# Patient Record
Sex: Male | Born: 1955 | Race: White | Hispanic: No
Health system: Southern US, Community
[De-identification: ages and names within clinical notes are randomized; demographics above are authoritative.]

## PROBLEM LIST (undated history)

## (undated) DIAGNOSIS — J45909 Unspecified asthma, uncomplicated: Secondary | ICD-10-CM

## (undated) HISTORY — PX: PROSTATECTOMY: SHX69

---

## 2008-07-22 ENCOUNTER — Ambulatory Visit: Payer: Self-pay | Admitting: Internal Medicine

## 2010-10-24 ENCOUNTER — Ambulatory Visit: Payer: Self-pay | Admitting: Family Medicine

## 2019-05-25 ENCOUNTER — Ambulatory Visit: Payer: Self-pay | Attending: Internal Medicine

## 2019-05-25 ENCOUNTER — Ambulatory Visit: Payer: Self-pay

## 2019-05-25 DIAGNOSIS — Z23 Encounter for immunization: Secondary | ICD-10-CM

## 2019-05-25 NOTE — Progress Notes (Signed)
   Covid-19 Vaccination Clinic  Name:  Dean Wall    MRN: [id number] DOB: 05/02/1955  05/25/2019  [name] was observed post Covid-19 immunization for 15 minutes without incident. He was provided with Vaccine Information Sheet and instruction to access the V-Safe system.   [name] was instructed to call 911 with any severe reactions post vaccine: Marland Kitchen Difficulty breathing  . Swelling of face and throat  . A fast heartbeat  . A bad rash all over body  . Dizziness and weakness   Immunizations Administered    Name Date Dose VIS Date Route   Pfizer COVID-19 Vaccine 05/25/2019  8:57 AM 0.3 mL 01/25/2019 Intramuscular   Manufacturer: [employer], [employer]   Lot: [id number]   NDC: [address]-7

## 2019-06-18 ENCOUNTER — Ambulatory Visit: Payer: Self-pay | Attending: Internal Medicine

## 2019-06-18 DIAGNOSIS — Z23 Encounter for immunization: Secondary | ICD-10-CM

## 2019-06-18 NOTE — Progress Notes (Signed)
   Covid-19 Vaccination Clinic  Name:  Dean Wall    MRN: [id number] DOB: 1956/02/11  06/18/2019  Mr. Heidelberg was observed post Covid-19 immunization for 15 minutes without incident. He was provided with Vaccine Information Sheet and instruction to access the V-Safe system.   Mr. Wimer was instructed to call 911 with any severe reactions post vaccine: Marland Kitchen Difficulty breathing  . Swelling of face and throat  . A fast heartbeat  . A bad rash all over body  . Dizziness and weakness   Immunizations Administered    Name Date Dose VIS Date Route   Pfizer COVID-19 Vaccine 06/18/2019  3:58 PM 0.3 mL 04/10/2018 Intramuscular   Manufacturer: [employer], [employer]   Lot: [id number]   NDC: [address]-6

## 2019-12-14 ENCOUNTER — Encounter: Payer: Self-pay | Admitting: Emergency Medicine

## 2019-12-14 ENCOUNTER — Emergency Department
Admission: EM | Admit: 2019-12-14 | Discharge: 2019-12-14 | Disposition: A | Discharge status: Home / Self Care | Payer: BC Managed Care – PPO | Attending: Emergency Medicine | Admitting: Emergency Medicine

## 2019-12-14 ENCOUNTER — Emergency Department: Payer: BC Managed Care – PPO

## 2019-12-14 ENCOUNTER — Other Ambulatory Visit: Payer: Self-pay

## 2019-12-14 DIAGNOSIS — R1031 Right lower quadrant pain: Secondary | ICD-10-CM | POA: Diagnosis present

## 2019-12-14 DIAGNOSIS — J45909 Unspecified asthma, uncomplicated: Secondary | ICD-10-CM | POA: Insufficient documentation

## 2019-12-14 DIAGNOSIS — R109 Unspecified abdominal pain: Secondary | ICD-10-CM

## 2019-12-14 HISTORY — DX: Unspecified asthma, uncomplicated: J45.909

## 2019-12-14 LAB — URINALYSIS, COMPLETE (UACMP) WITH MICROSCOPIC
Bacteria, UA: NONE SEEN
Bilirubin Urine: NEGATIVE
Glucose, UA: NEGATIVE mg/dL
Hgb urine dipstick: NEGATIVE
Ketones, ur: 5 mg/dL — AB
Leukocytes,Ua: NEGATIVE
Nitrite: NEGATIVE
Protein, ur: NEGATIVE mg/dL
Specific Gravity, Urine: 1.017 (ref 1.005–1.030)
Squamous Epithelial / HPF: NONE SEEN (ref 0–5)
WBC, UA: NONE SEEN WBC/hpf (ref 0–5)
pH: 5 (ref 5.0–8.0)

## 2019-12-14 LAB — COMPREHENSIVE METABOLIC PANEL
ALT: 44 U/L (ref 0–44)
AST: 23 U/L (ref 15–41)
Albumin: 4.3 g/dL (ref 3.5–5.0)
Alkaline Phosphatase: 68 U/L (ref 38–126)
Anion gap: 9 (ref 5–15)
BUN: 14 mg/dL (ref 8–23)
CO2: 24 mmol/L (ref 22–32)
Calcium: 9.2 mg/dL (ref 8.9–10.3)
Chloride: 106 mmol/L (ref 98–111)
Creatinine, Ser: 0.77 mg/dL (ref 0.61–1.24)
GFR, Estimated: >60 mL/min (ref >60)
Glucose, Bld: 108 mg/dL — ABNORMAL HIGH (ref 70–99)
Potassium: 4.3 mmol/L (ref 3.5–5.1)
Sodium: 139 mmol/L (ref 135–145)
Total Bilirubin: 0.7 mg/dL (ref 0.3–1.2)
Total Protein: 7.4 g/dL (ref 6.5–8.1)

## 2019-12-14 LAB — CBC
HCT: 48.1 % (ref 39.0–52.0)
Hemoglobin: 16.8 g/dL (ref 13.0–17.0)
MCH: 30.1 pg (ref 26.0–34.0)
MCHC: 34.9 g/dL (ref 30.0–36.0)
MCV: 86.2 fL (ref 80.0–100.0)
Platelets: 214 10*3/uL (ref 150–400)
RBC: 5.58 MIL/uL (ref 4.22–5.81)
RDW: 12.3 % (ref 11.5–15.5)
WBC: 6.2 10*3/uL (ref 4.0–10.5)
nRBC: 0 % (ref 0.0–0.2)

## 2019-12-14 LAB — LIPASE, BLOOD: Lipase: 37 U/L (ref 11–51)

## 2019-12-14 MED ORDER — IOHEXOL 300 MG/ML  SOLN
100.0000 mL | Freq: Once | INTRAMUSCULAR | Status: AC | PRN
  Administered 2019-12-14: 100 mL via INTRAVENOUS

## 2019-12-14 MED ORDER — POLYETHYLENE GLYCOL 3350 17 GM/SCOOP PO POWD: 17.0000 g | Freq: Every day | ORAL | 0 refills | Status: AC | PRN

## 2019-12-14 MED ORDER — IOHEXOL 9 MG/ML PO SOLN
500.0000 mL | Freq: Two times a day (BID) | ORAL | Status: DC | PRN
  Administered 2019-12-14: 500 mL via ORAL

## 2019-12-14 NOTE — ED Notes (Addendum)
Patient here for right lower abdominal pain, says his pain has decreased from a 6 to a 4 since he has been in the ED. Patient says this is the second time it has awaken him out of his sleep, he denies N/V says his urine has been darker, no blood in stool. No fever. Patient says the only thing he remembers having a hx of is diverticulitis when younger

## 2019-12-14 NOTE — ED Triage Notes (Signed)
Patient with complaint of right lower abdominal pain that started Friday morning. Patient states that the pain has become more constant. Patient denies nausea, vomiting or urinary symptoms.

## 2019-12-14 NOTE — ED Provider Notes (Signed)
[facility] Emergency Department Provider Note  Time seen: 9:13 AM  I have reviewed the triage vital signs and the nursing notes.   HISTORY  Chief Complaint Abdominal Pain   HPI Dean Wall is a 64 y.o. male with a past medical history of asthma presents to the emergency department for right lower quadrant abdominal pain.  According to the patient since yesterday morning he has been experiencing right lower quadrant abdominal pain.  States it occurred yesterday morning and then went away but recurred yesterday evening and woke him up overnight.  States the pain was a 6/10 aching type pain currently states it is a 4/10 mild pain.  Denies any nausea vomiting diarrhea.  Denies any fever.  Denies dysuria or hematuria.  Past Medical History:  Diagnosis Date  . Asthma     There are no problems to display for this patient.   Past Surgical History:  Procedure Laterality Date  . PROSTATECTOMY      Prior to Admission medications   Not on File    No Known Allergies  No family history on file.  Social History Social History   Tobacco Use  . Smoking status: Never Smoker  . Smokeless tobacco: Never Used  Substance Use Topics  . Alcohol use: Yes    Comment: occ  . Drug use: Never    Review of Systems Constitutional: Negative for fever. Cardiovascular: Negative for chest pain. Respiratory: Negative for shortness of breath. Gastrointestinal: Moderate right lower quadrant abdominal pain.  Negative for nausea vomiting or diarrhea Genitourinary: Negative for urinary compaints Neurological: Negative for headache All other ROS negative  ____________________________________________   PHYSICAL EXAM:  VITAL SIGNS: ED Triage Vitals [12/14/19 0637]  Enc Vitals Group     BP (!) 156/84     Pulse Rate 93     Resp 18     Temp 98.2 F (36.8 C)     Temp Source Oral     SpO2 96 %     Weight 232 lb (105.2 kg)     Height 6\' 1"  (1.854 m)     Head  Circumference      Peak Flow      Pain Score 6     Pain Loc      Pain Edu?      Excl. in GC?    Constitutional: Alert and oriented. Well appearing and in no distress. Eyes: Normal exam ENT      Head: Normocephalic and atraumatic.      Mouth/Throat: Mucous membranes are moist. Cardiovascular: Normal rate, regular rhythm. No murmur Respiratory: Normal respiratory effort without tachypnea nor retractions. Breath sounds are clear  Gastrointestinal: Soft, mild to moderate right lower quadrant abdominal tenderness palpation without rebound guarding or distention.  Abdomen otherwise benign. Musculoskeletal: Nontender with normal range of motion in all extremities.  Neurologic:  Normal speech and language. No gross focal neurologic deficits  Skin:  Skin is warm, dry and intact.  Psychiatric: Mood and affect are normal.   ____________________________________________     RADIOLOGY  CT is essentially negative besides constipation.  ____________________________________________   INITIAL IMPRESSION / ASSESSMENT AND PLAN / ED COURSE  Pertinent labs & imaging results that were available during my care of the patient were reviewed by me and considered in my medical decision making (see chart for details).   Patient presents to the emergency department for right lower quadrant abdominal pain since yesterday.  Overall the patient appears well, no acute distress.  Does have mild to moderate tenderness palpation in the right lower quadrant without rebound guarding or distention.  No other tenderness identified on exam.  However given the right lower quadrant tenderness even though his labs are normal we will proceed with CT scan abdomen/pelvis to evaluate for possible appendicitis.  Patient denies any prior abdominal surgeries.  CT scans essentially negative besides moderate constipation.  We will discharge on MiraLAX.  Have the patient follow-up with his PCP.  Discussed my typical abdominal pain  return precautions.  Dean Wall was evaluated in Emergency Department on 12/14/2019 for the symptoms described in the history of present illness. He was evaluated in the context of the global COVID-19 pandemic, which necessitated consideration that the patient might be at risk for infection with the SARS-CoV-2 virus that causes COVID-19. Institutional protocols and algorithms that pertain to the evaluation of patients at risk for COVID-19 are in a state of rapid change based on information released by regulatory bodies including the CDC and federal and state organizations. These policies and algorithms were followed during the patient's care in the ED.  ____________________________________________   FINAL CLINICAL IMPRESSION(S) / ED DIAGNOSES  Right lower quadrant abdominal pain   Minna Antis, MD 12/14/19 1212

## 2021-10-19 IMAGING — CT CT ABD-PELV W/ CM
2 of 5 series · 16 of 46 positions shown, 18 images · IV contrast (APPLIED)
Comparison: None.

CLINICAL DATA: Right lower quadrant abdominal pain. Clinical
suspicion for appendicitis.

EXAM:
CT ABDOMEN AND PELVIS WITH CONTRAST
TECHNIQUE: Multidetector CT imaging of the abdomen and pelvis was performed
using the standard protocol following bolus administration of
intravenous contrast.
CONTRAST:  100mL OMNIPAQUE IOHEXOL 300 MG/ML  SOLN

[Series 2: routine abd/pel with · axial · 0.90mm/px · z∈[-574,-130]mm · 13 of 101 slices shown, 15 images]
[im 6/101  soft-tissue]
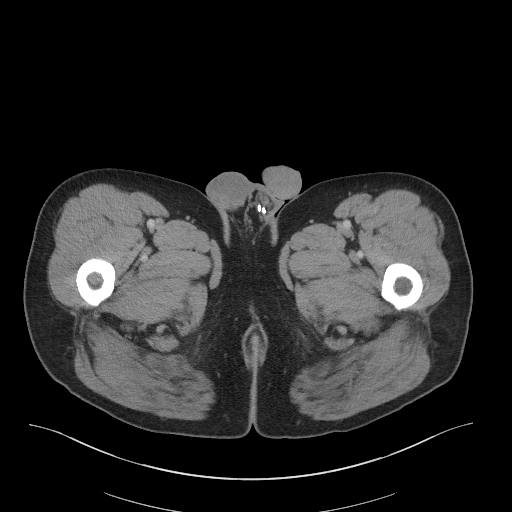
[im 6/101  bone]
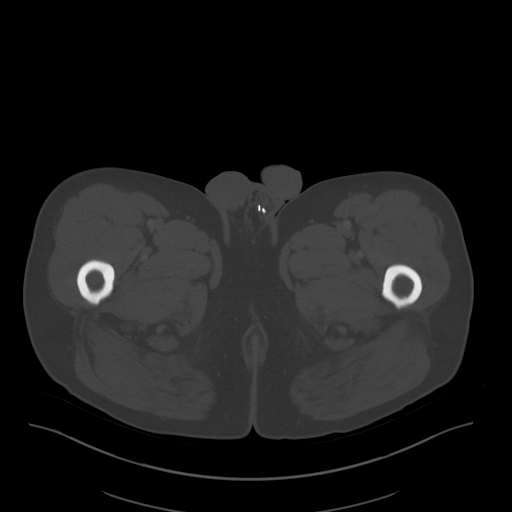
[im 12/101  soft-tissue]
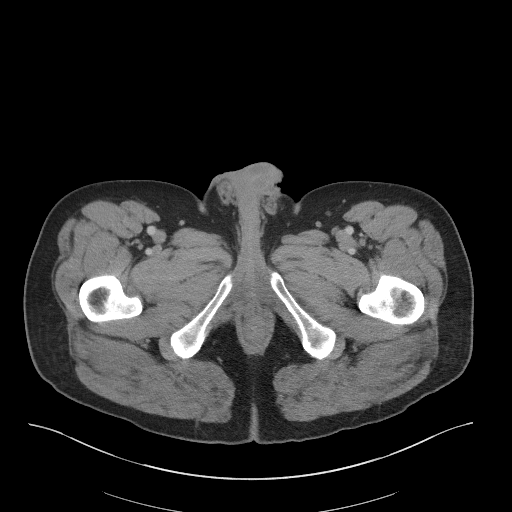
[im 24/101  soft-tissue]
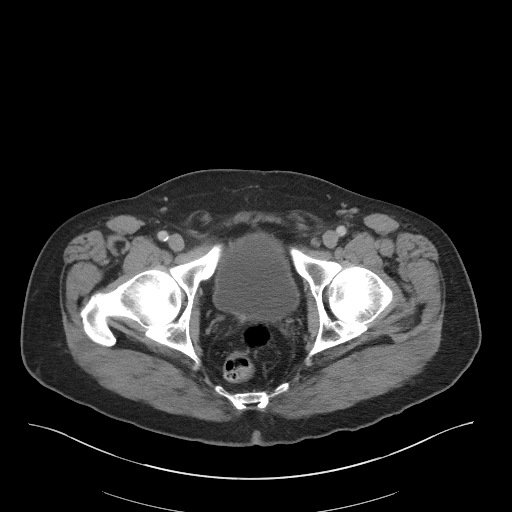
[im 30/101  soft-tissue]
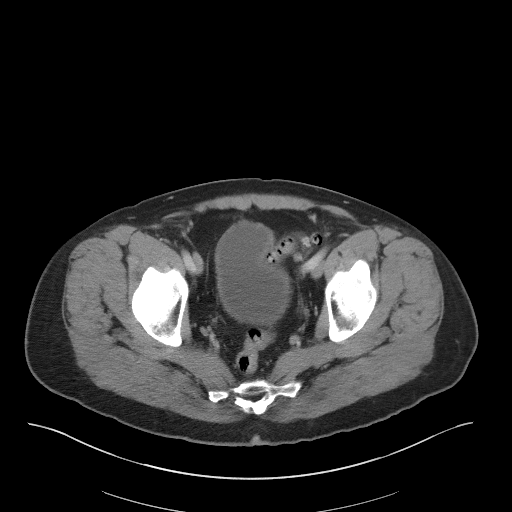
[im 36/101  soft-tissue]
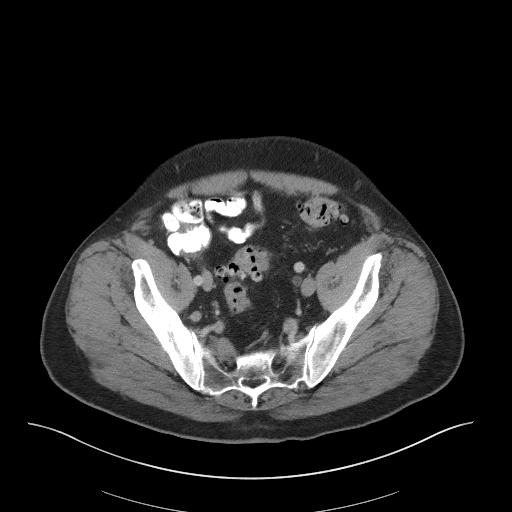
[im 42/101  soft-tissue]
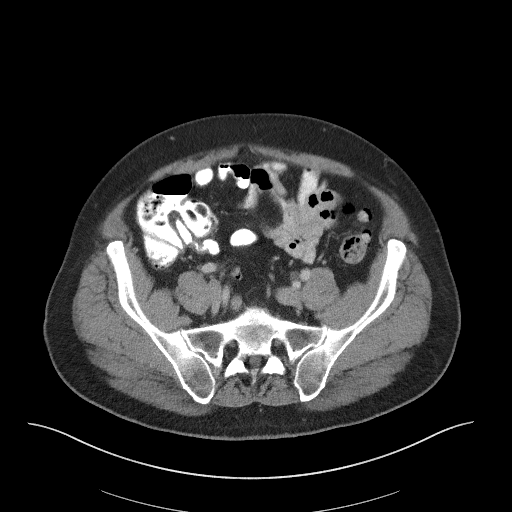
[im 53/101  soft-tissue]
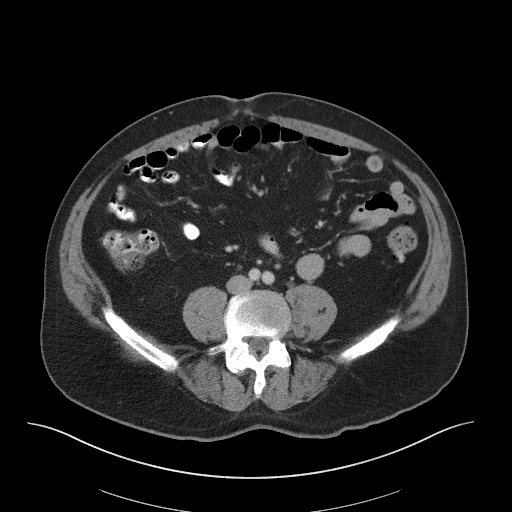
[im 59/101  soft-tissue]
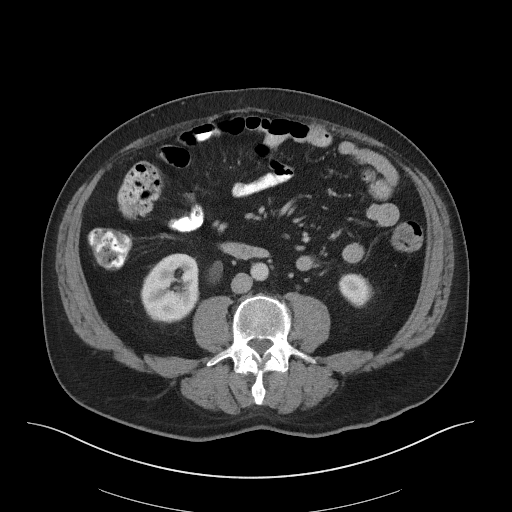
[im 65/101  soft-tissue]
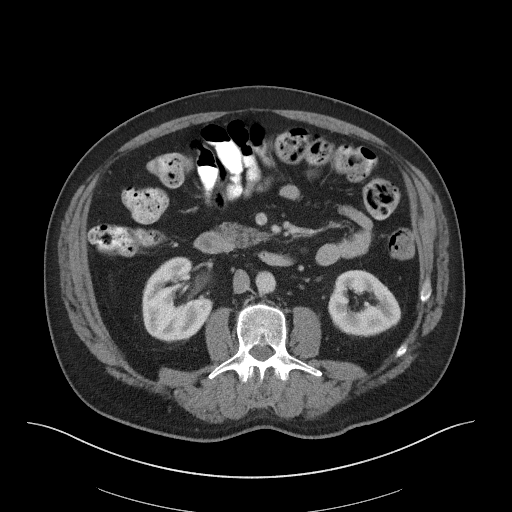
[im 65/101  bone]
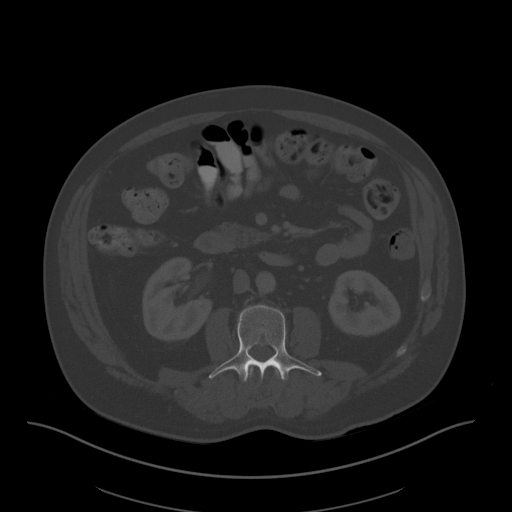
[im 71/101  soft-tissue]
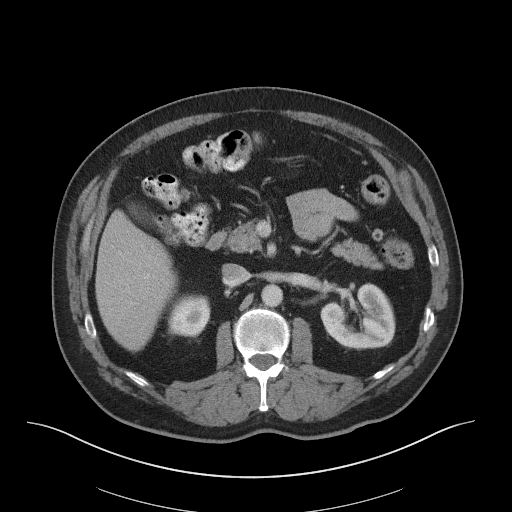
[im 77/101  soft-tissue]
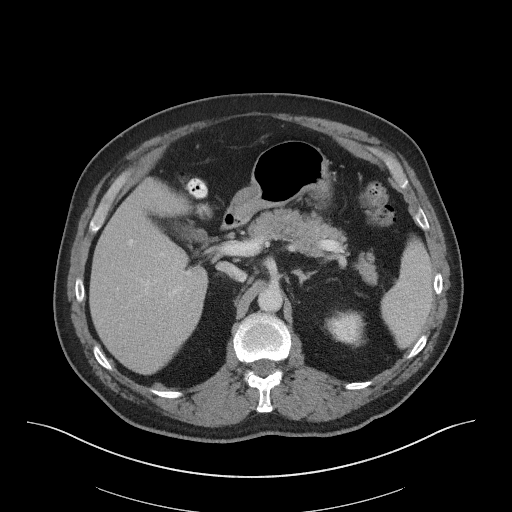
[im 89/101  soft-tissue]
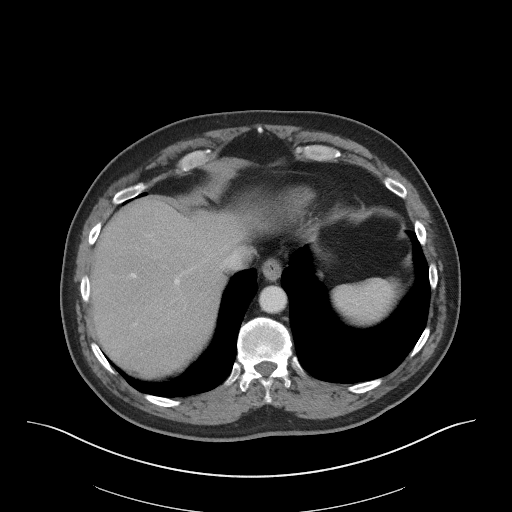
[im 95/101  soft-tissue]
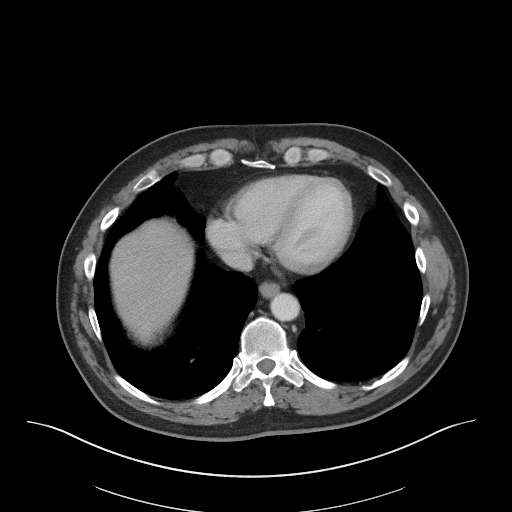

[Series 5: coronal st · coronal · 0.77mm/px · 3 of 109 slices shown]
[im 37/109  soft-tissue]
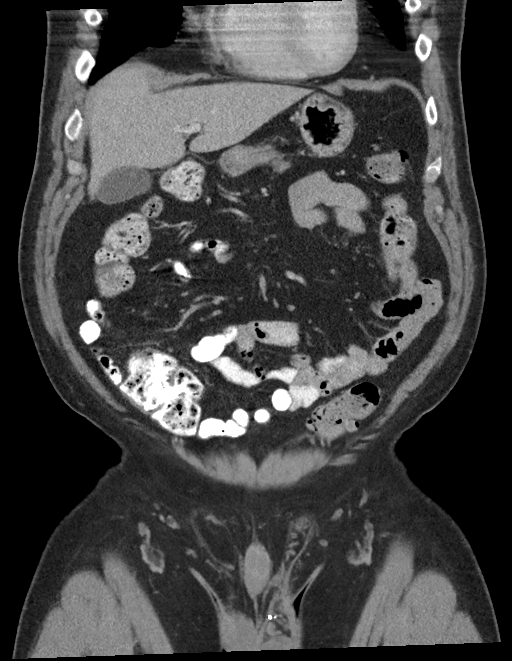
[im 49/109  soft-tissue]
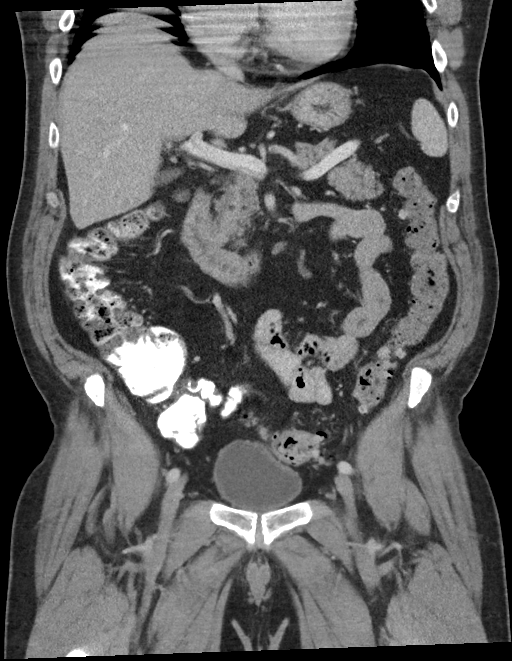
[im 61/109  soft-tissue]
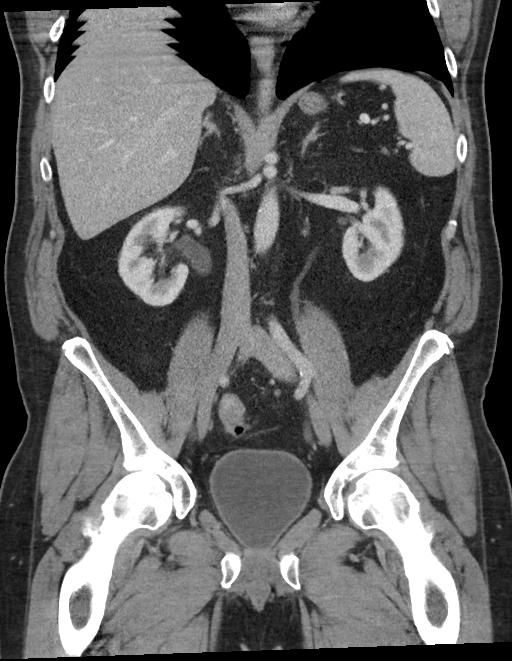

[16 of 46 positions shown; findings below may reference images not displayed]

FINDINGS: Lower chest: No acute abnormality.

Hepatobiliary: No focal liver abnormality is seen. No gallstones,
gallbladder wall thickening, or biliary dilatation.

Pancreas: Unremarkable. No pancreatic ductal dilatation or
surrounding inflammatory changes.

Spleen: Normal in size without focal abnormality.

Adrenals/Urinary Tract: Adrenal glands are unremarkable. Kidneys are
normal, without renal calculi, focal lesion, or hydronephrosis.
Bladder is unremarkable.

Stomach/Bowel: Normal, decompressed appendix visualized.

Normal stomach and small bowel.

No colonic wall thickening or inflammation. Multiple left colon
diverticula without diverticulitis. Mild increased stool burden
throughout the colon.

Vascular/Lymphatic: No significant vascular findings are present. No
enlarged abdominal or pelvic lymph nodes.

Reproductive: Previous prostatectomy.  Otherwise unremarkable.

Other: No abdominal wall hernia or abnormality. No abdominopelvic
ascites.

Musculoskeletal: No fracture or acute finding. No osteoblastic or
osteolytic lesions.
IMPRESSION: 1. No acute findings. No appendicitis. No findings to account for
right lower quadrant pain.
2. Colonic diverticula without diverticulitis. Mild increased
colonic stool burden.
